# Patient Record
Sex: Male | Born: 1945 | Race: Black or African American | Hispanic: No | Marital: Single
Health system: Southern US, Community
[De-identification: ages and names within clinical notes are randomized; demographics above are authoritative.]

---

## 2017-06-27 ENCOUNTER — Emergency Department: Payer: Medicare HMO

## 2017-06-27 ENCOUNTER — Other Ambulatory Visit: Payer: Self-pay

## 2017-06-27 ENCOUNTER — Emergency Department
Admission: EM | Admit: 2017-06-27 | Discharge: 2017-06-27 | Disposition: A | Discharge status: Home / Self Care | Payer: Medicare HMO | Attending: Student in an Organized Health Care Education/Training Program | Admitting: Student in an Organized Health Care Education/Training Program

## 2017-06-27 DIAGNOSIS — R531 Weakness: Secondary | ICD-10-CM | POA: Diagnosis not present

## 2017-06-27 DIAGNOSIS — D649 Anemia, unspecified: Secondary | ICD-10-CM | POA: Insufficient documentation

## 2017-06-27 LAB — COMPREHENSIVE METABOLIC PANEL
ALT: 10 U/L — ABNORMAL LOW (ref 17–63)
ANION GAP: 13 (ref 5–15)
AST: 35 U/L (ref 15–41)
Albumin: 4.3 g/dL (ref 3.5–5.0)
Alkaline Phosphatase: 297 U/L — ABNORMAL HIGH (ref 38–126)
BILIRUBIN TOTAL: 1 mg/dL (ref 0.3–1.2)
BUN: 20 mg/dL (ref 6–20)
CO2: 20 mmol/L — ABNORMAL LOW (ref 22–32)
Calcium: 9.4 mg/dL (ref 8.9–10.3)
Chloride: 106 mmol/L (ref 101–111)
Creatinine, Ser: 1.81 mg/dL — ABNORMAL HIGH (ref 0.61–1.24)
GFR, EST AFRICAN AMERICAN: 41 mL/min — AB (ref >60)
GFR, EST NON AFRICAN AMERICAN: 35 mL/min — AB (ref >60)
Glucose, Bld: 206 mg/dL — ABNORMAL HIGH (ref 65–99)
POTASSIUM: 4.2 mmol/L (ref 3.5–5.1)
Sodium: 139 mmol/L (ref 135–145)
TOTAL PROTEIN: 7.7 g/dL (ref 6.5–8.1)

## 2017-06-27 LAB — CBC WITH DIFFERENTIAL/PLATELET
Basophils Absolute: 0 10*3/uL (ref 0–0.1)
Basophils Relative: 1 %
Eosinophils Absolute: 0.2 10*3/uL (ref 0–0.7)
Eosinophils Relative: 3 %
HEMATOCRIT: 29.5 % — AB (ref 40.0–52.0)
Hemoglobin: 9.3 g/dL — ABNORMAL LOW (ref 13.0–18.0)
Lymphocytes Relative: 25 %
Lymphs Abs: 1.3 10*3/uL (ref 1.0–3.6)
MCH: 21.6 pg — AB (ref 26.0–34.0)
MCHC: 31.4 g/dL — ABNORMAL LOW (ref 32.0–36.0)
MCV: 68.7 fL — AB (ref 80.0–100.0)
MONO ABS: 0.3 10*3/uL (ref 0.2–1.0)
MONOS PCT: 7 %
NEUTROS ABS: 3.5 10*3/uL (ref 1.4–6.5)
Neutrophils Relative %: 64 %
Platelets: 363 10*3/uL (ref 150–440)
RBC: 4.3 MIL/uL — ABNORMAL LOW (ref 4.40–5.90)
RDW: 20.8 % — AB (ref 11.5–14.5)
WBC: 5.4 10*3/uL (ref 3.8–10.6)

## 2017-06-27 LAB — URINALYSIS, COMPLETE (UACMP) WITH MICROSCOPIC
BACTERIA UA: NONE SEEN
Bilirubin Urine: NEGATIVE
GLUCOSE, UA: NEGATIVE mg/dL
Hgb urine dipstick: NEGATIVE
Ketones, ur: 5 mg/dL — AB
Leukocytes, UA: NEGATIVE
NITRITE: NEGATIVE
PROTEIN: NEGATIVE mg/dL
SPECIFIC GRAVITY, URINE: 1.011 (ref 1.005–1.030)
pH: 5 (ref 5.0–8.0)

## 2017-06-27 LAB — TROPONIN I

## 2017-06-27 LAB — CK: CK TOTAL: 110 U/L (ref 49–397)

## 2017-06-27 MED ORDER — SODIUM CHLORIDE 0.9 % IV BOLUS
1000.0000 mL | Freq: Once | INTRAVENOUS | Status: AC
  Administered 2017-06-27: 1000 mL via INTRAVENOUS

## 2017-06-27 MED ORDER — IRON 325 (65 FE) MG PO TABS: 1.0000 | ORAL_TABLET | ORAL | 0 refills | Status: AC

## 2017-06-27 NOTE — ED Provider Notes (Signed)
[facility] Emergency Department Provider Note    First MD Initiated Contact with Patient 06/27/17 1447     (approximate)  I have reviewed the triage vital signs and the nursing notes.   HISTORY  Chief Complaint No chief complaint on file.    HPI Vincent Garcia is a 72 y.o. male patient with history of diabetes presents for confusion after being found in his car.  Patient states he was feeling overheated and went to go lay down in his car.  Family members found him laying down in his car and was more confused than normal.  EMS called out to the scene and brought him to the ER for confusion was worried that he had had heat exposure though they did not document her test his temperature.  Patient moving all extremities.  Denies any numbness or tingling.  Is not had symptoms like this in the past.  Alert and oriented x3.  Denies any palpitations, chest pain, shortness of breath, headache, nausea or vomiting.  No past medical history on file. No family history on file.  There are no active problems to display for this patient.     Prior to Admission medications   Not on File    Allergies Patient has no allergy information on record.    Social History Social History   Tobacco Use  . Smoking status: Not on file  Substance Use Topics  . Alcohol use: Not on file  . Drug use: Not on file    Review of Systems Patient denies headaches, rhinorrhea, blurry vision, numbness, shortness of breath, chest pain, edema, cough, abdominal pain, nausea, vomiting, diarrhea, dysuria, fevers, rashes or hallucinations unless otherwise stated above in HPI. ____________________________________________   PHYSICAL EXAM:  VITAL SIGNS: There were no vitals filed for this visit.  Constitutional: Alert and oriented x 3.  in no acute distress. Eyes: Conjunctivae are normal.  Head: Atraumatic. Nose: No congestion/rhinnorhea. Mouth/Throat: Mucous membranes are moist.     Neck: No stridor. Painless ROM.  Cardiovascular: Normal rate, regular rhythm. Grossly normal heart sounds.  Good peripheral circulation. Respiratory: Normal respiratory effort.  No retractions. Lungs CTAB. Gastrointestinal: Soft and nontender. No distention. No abdominal bruits. No CVA tenderness. Genitourinary: deferred Musculoskeletal: No lower extremity tenderness nor edema.  No joint effusions. Neurologic:  CN- intact.  No facial droop, Normal FNF.  Normal heel to shin.  Sensation intact bilaterally. Normal speech and language. No gross focal neurologic deficits are appreciated. No gait instability. Skin:  Skin is warm, dry and intact. No rash noted. Psychiatric: Mood and affect are normal. Speech and behavior are normal.  ____________________________________________   LABS (all labs ordered are listed, but only abnormal results are displayed)  No results found for this or any previous visit (from the past 24 hour(s)). ____________________________________________  EKG My review and personal interpretation at Time: 14:45   Indication: heat exposure  Rate: 95  Rhythm: sinus Axis: normal Other: normal intervals, no stemi ____________________________________________  RADIOLOGY  I personally reviewed all radiographic images ordered to evaluate for the above acute complaints and reviewed radiology reports and findings.  These findings were personally discussed with the patient.  Please see medical record for radiology report.  ____________________________________________   PROCEDURES  Procedure(s) performed:  Procedures    Critical Care performed: no ____________________________________________   INITIAL IMPRESSION / ASSESSMENT AND PLAN / ED COURSE  Pertinent labs & imaging results that were available during my care of the patient were reviewed by me and  considered in my medical decision making (see chart for details).  DDX: Heat syncope, heat exhaustion, dehydration,  ACS, TIA, hyperglycemia  Vincent Garcia is a 72 y.o. who presents to the ED with symptoms as described above.  Patient well-appearing currently.  No respiratory distress.  Nonfocal neuro exam.  No evidence of infectious process.  Due to suspicion for dehydration therefore will give IV fluids and reassess.  She does not seem clinically consistent with stroke or TIA.  The patient will be placed on continuous pulse oximetry and telemetry for monitoring.  Laboratory evaluation will be sent to evaluate for the above complaints.     Clinical Course as of Jun 27 1945  Wed Jun 27, 2017  1624 Patient up ambulating with steady gait   [PR]  1726 Patient reassessed.  Denies any weakness.  Brother at bedside states he is done this before when he is worked out in the heat.  Patient requesting discharge home.  I did recommend the patient stay for additional observation repeat enzyme and monitoring.  He has no focal neuro deficit therefore I do not believe this is related to stroke.  Does appear to have probable component of iron deficiency anemia.  Adamantly denies that he is not having any melena or hematochezia.  This does not seem clinically consistent with bleed.  No hypoxia to suggest pneumonia or congestive heart failure.  As he is in no acute distress adequate mentation and demonstrates understanding of signs and symptoms for which she should return to the ER to believe this is consistent with dehydration probable heat illness.  Have discussed with the patient and available family all diagnostics and treatments performed thus far and all questions were answered to the best of my ability. The patient demonstrates understanding and agreement with plan.    [PR]    Clinical Course User Index [PR] Willy Eddyobinson, Keimya Briddell, MD     As part of my medical decision making, I reviewed the following data within the electronic MEDICAL RECORD NUMBER Nursing notes reviewed and incorporated, Labs reviewed, notes from prior ED visits  and [place] Controlled Substance Database   ____________________________________________   FINAL CLINICAL IMPRESSION(S) / ED DIAGNOSES  Final diagnoses:  Weakness  Anemia, unspecified type      NEW MEDICATIONS STARTED DURING THIS VISIT:  New Prescriptions   No medications on file     Note:  This document was prepared using Dragon voice recognition software and may include unintentional dictation errors.    Willy Eddyobinson, Yocheved Depner, MD 06/27/17 [phone]

## 2017-06-27 NOTE — ED Triage Notes (Signed)
Pt arrived via [facility] from car impound where he was trying to get car. EMS states that pt was with friend and found pt in car with three shirts and not responding appropriately.

## 2019-04-15 IMAGING — CR DG CHEST 2V
2 series · 2 of 2 positions shown · non-contrast
Comparison: None.

CLINICAL DATA: Acute mental status changes.

EXAM:
CHEST - 2 VIEW

[chest pa]
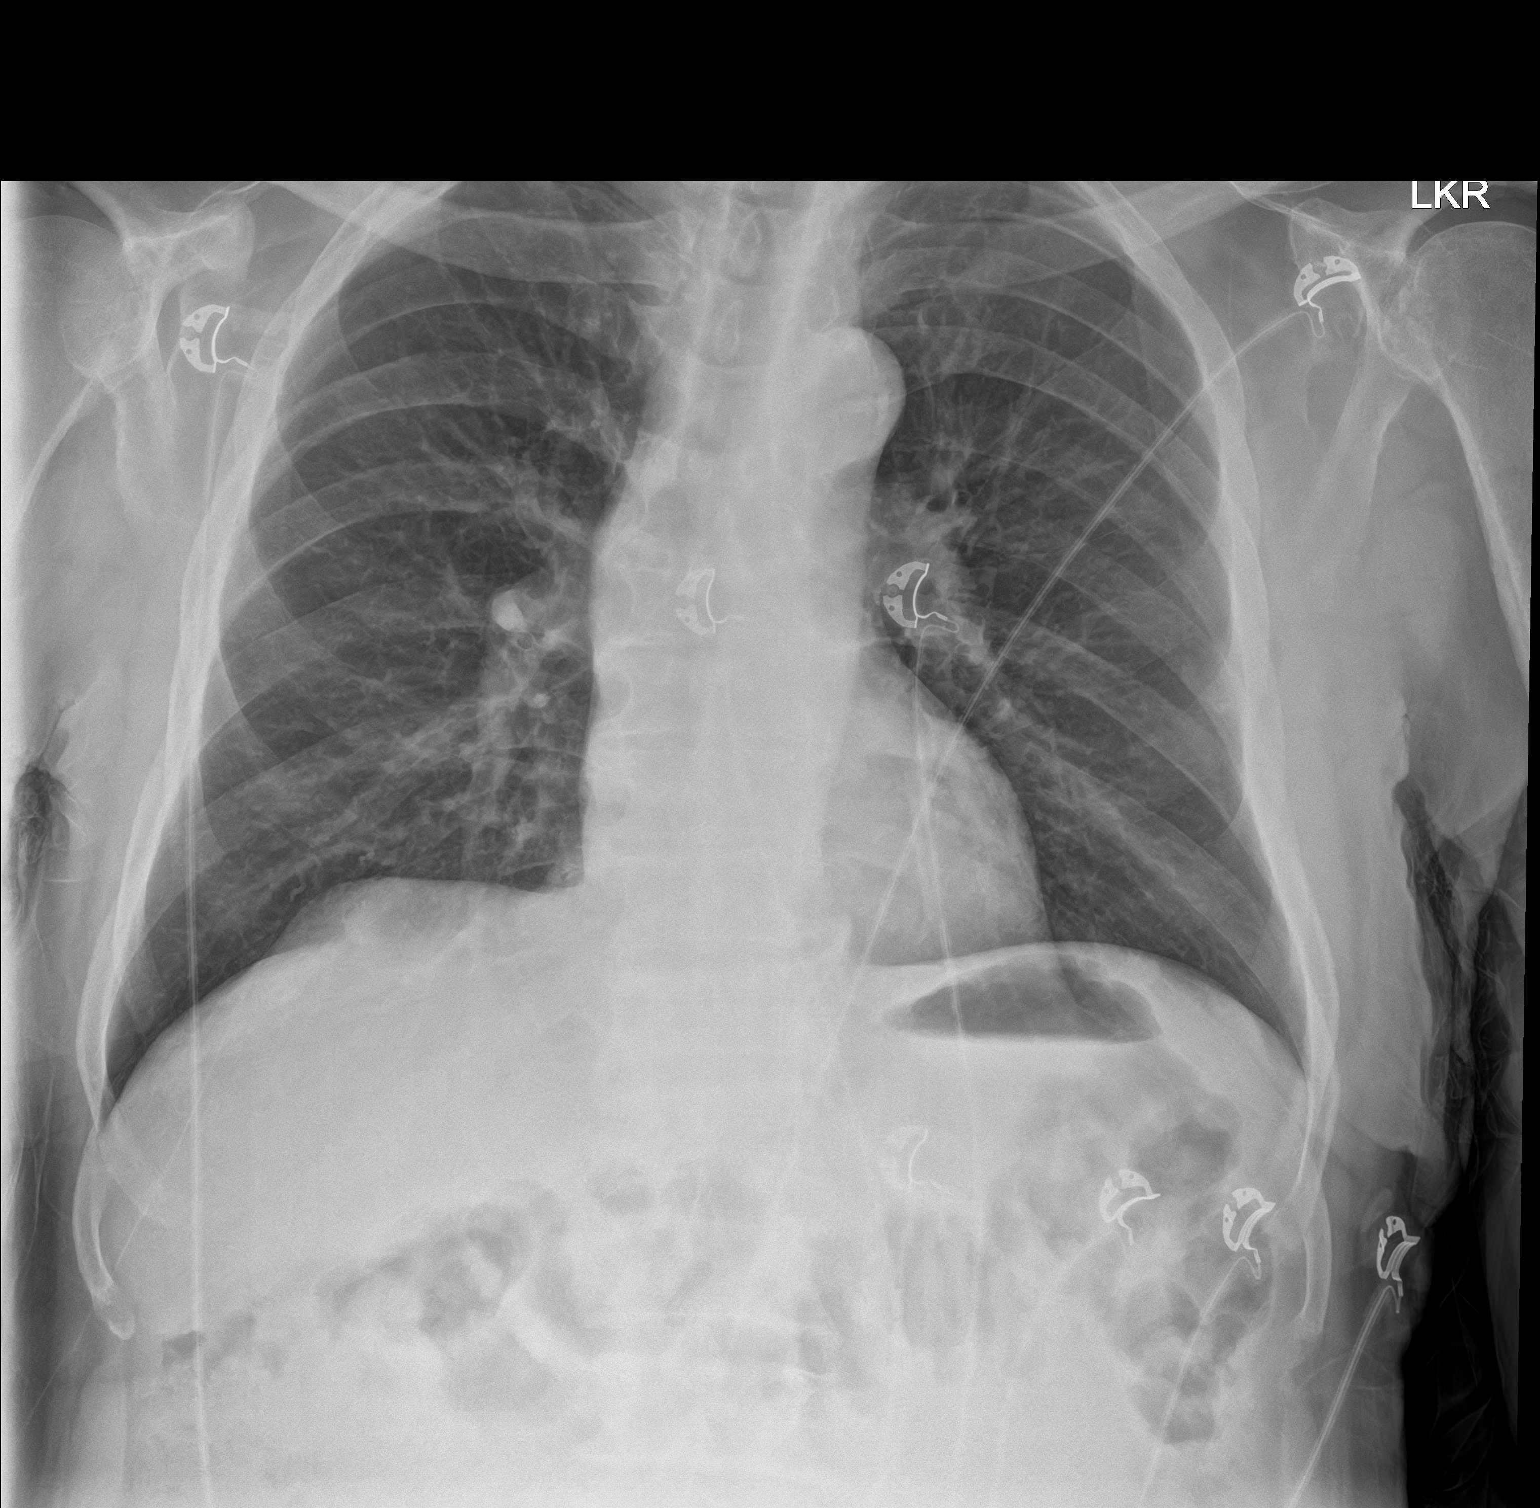

[chest lat]
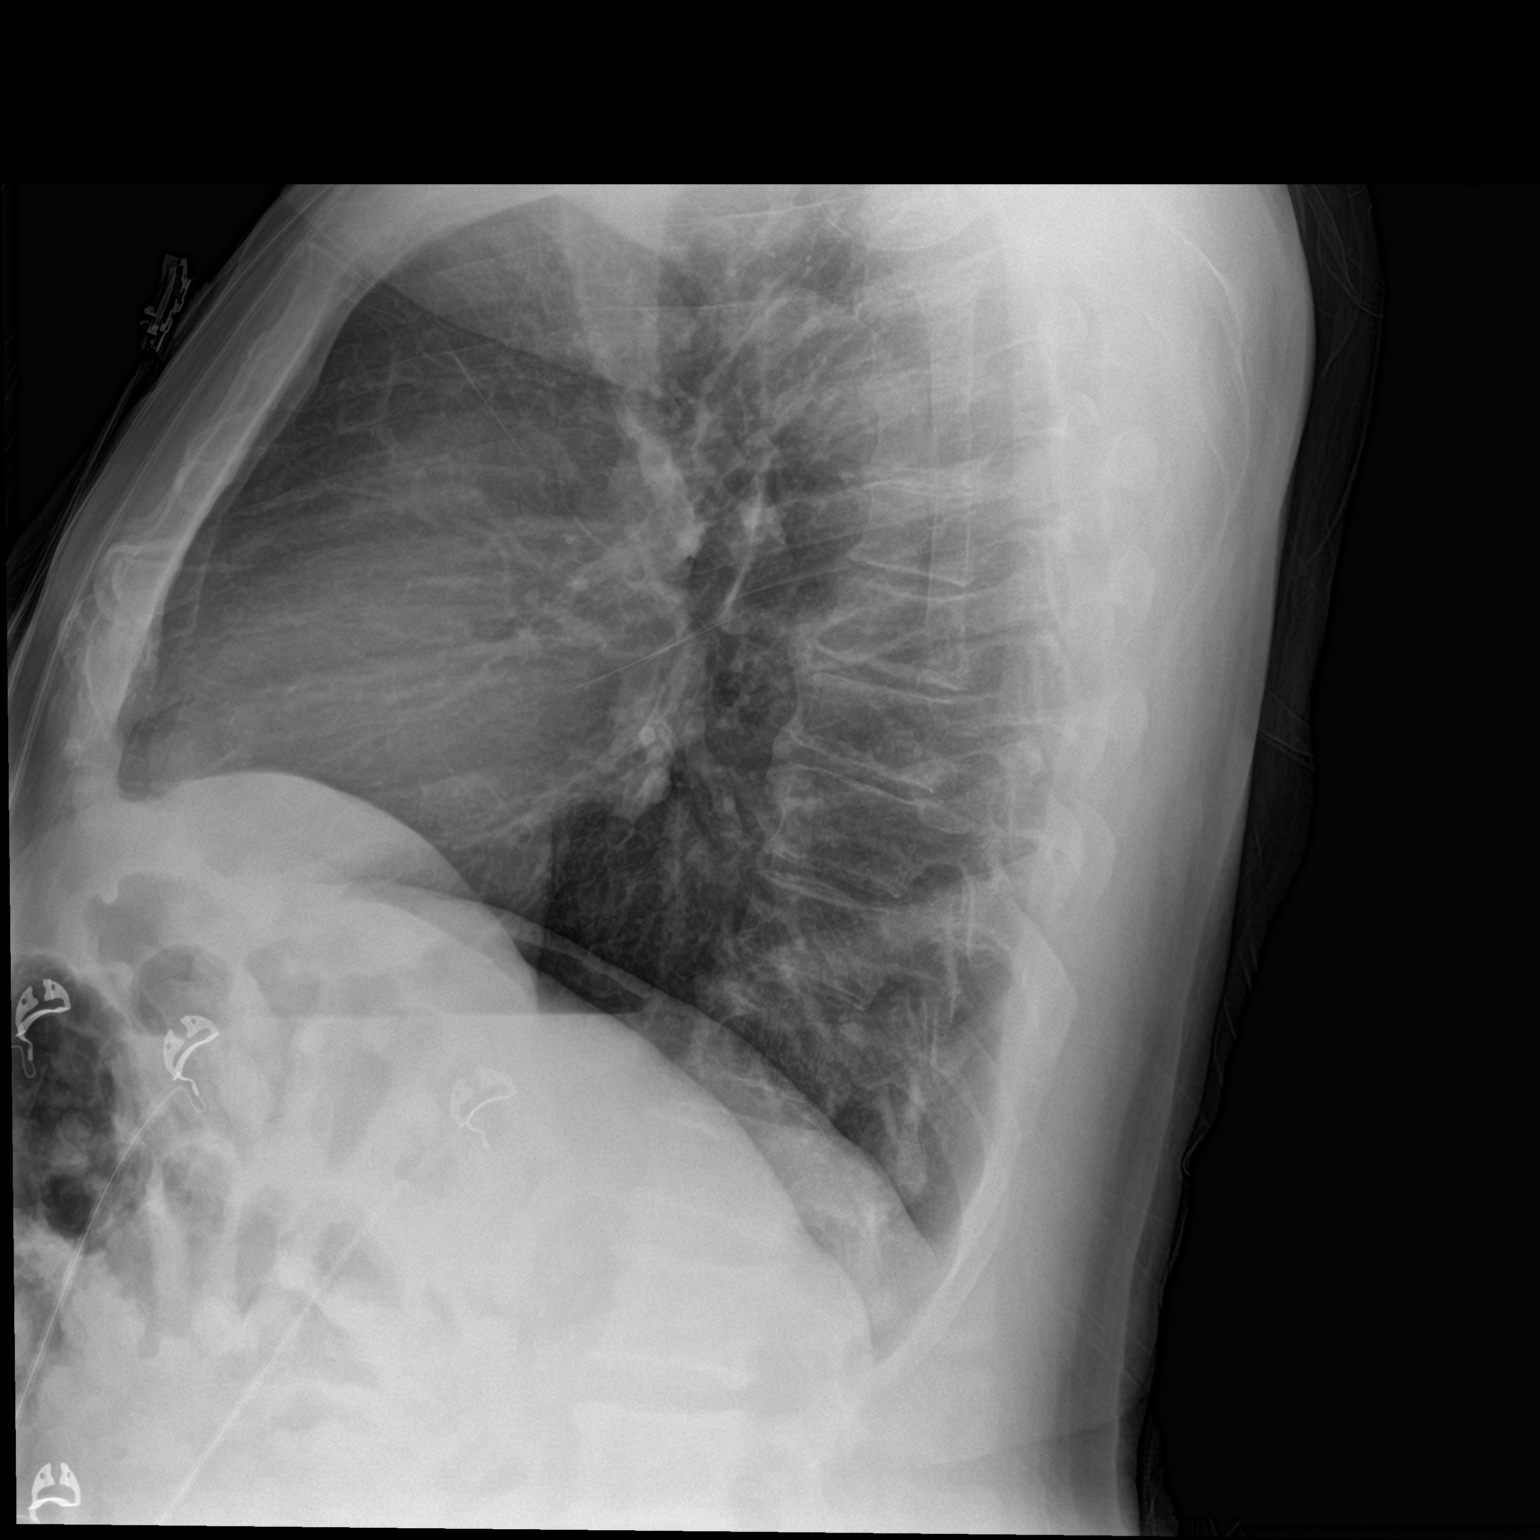

[2 of 2 positions shown; findings below may reference images not displayed]

FINDINGS: Heart size is normal. There is aortic atherosclerosis. The lungs are
clear. The vascularity is normal. No effusions. Ordinary
degenerative changes affect the spine.
IMPRESSION: No active disease.  Aortic atherosclerosis.

## 2019-06-08 ENCOUNTER — Ambulatory Visit: Payer: Medicare HMO | Attending: Internal Medicine

## 2019-06-08 DIAGNOSIS — Z23 Encounter for immunization: Secondary | ICD-10-CM

## 2019-06-08 NOTE — Progress Notes (Signed)
   Covid-19 Vaccination Clinic  Name:  Vincent Garcia    MRN: [id number] DOB: [birth year]  06/08/2019  Vincent Garcia was observed post Covid-19 immunization for 15 minutes without incident. He was provided with Vaccine Information Sheet and instruction to access the V-Safe system.   Vincent Garcia was instructed to call 911 with any severe reactions post vaccine: Marland Kitchen Difficulty breathing  . Swelling of face and throat  . A fast heartbeat  . A bad rash all over body  . Dizziness and weakness   Immunizations Administered    Name Date Dose VIS Date Route   Pfizer COVID-19 Vaccine 06/08/2019  2:38 PM 0.3 mL 02/21/2019 Intramuscular   Manufacturer: [employer], [employer]   Lot: [id number]   NDC: [address]-6

## 2019-07-01 ENCOUNTER — Ambulatory Visit: Payer: Medicare HMO | Attending: Internal Medicine

## 2019-07-01 DIAGNOSIS — Z23 Encounter for immunization: Secondary | ICD-10-CM

## 2019-07-01 NOTE — Progress Notes (Signed)
   Covid-19 Vaccination Clinic  Name:  Bertha Earwood    MRN: [id number] DOB: [birth year]  07/01/2019  Mr. Sorlie was observed post Covid-19 immunization for 15 minutes without incident. He was provided with Vaccine Information Sheet and instruction to access the V-Safe system.   Mr. Falotico was instructed to call 911 with any severe reactions post vaccine: Marland Kitchen Difficulty breathing  . Swelling of face and throat  . A fast heartbeat  . A bad rash all over body  . Dizziness and weakness   Immunizations Administered    Name Date Dose VIS Date Route   Pfizer COVID-19 Vaccine 07/01/2019  2:17 PM 0.3 mL 05/07/2018 Intramuscular   Manufacturer: [employer], [employer]   Lot: [id number]   NDC: [address]-4

## 2019-10-29 ENCOUNTER — Other Ambulatory Visit: Payer: Self-pay | Admitting: Orthopedic Surgery

## 2019-10-29 DIAGNOSIS — R948 Abnormal results of function studies of other organs and systems: Secondary | ICD-10-CM

## 2019-10-29 DIAGNOSIS — M899 Disorder of bone, unspecified: Secondary | ICD-10-CM

## 2019-11-18 ENCOUNTER — Ambulatory Visit
Admission: RE | Admit: 2019-11-18 | Discharge: 2019-11-18 | Disposition: A | Discharge status: Home / Self Care | Payer: Medicare HMO | Source: Ambulatory Visit | Attending: Orthopedic Surgery | Admitting: Orthopedic Surgery

## 2019-11-18 ENCOUNTER — Other Ambulatory Visit: Payer: Self-pay

## 2019-11-18 DIAGNOSIS — R948 Abnormal results of function studies of other organs and systems: Secondary | ICD-10-CM | POA: Insufficient documentation

## 2019-11-18 DIAGNOSIS — M899 Disorder of bone, unspecified: Secondary | ICD-10-CM | POA: Insufficient documentation

## 2019-11-18 MED ORDER — GADOBUTROL 1 MMOL/ML IV SOLN
10.0000 mL | Freq: Once | INTRAVENOUS | Status: AC | PRN
  Administered 2019-11-18: 10 mL via INTRAVENOUS

## 2020-12-25 ENCOUNTER — Other Ambulatory Visit: Payer: Self-pay

## 2020-12-25 ENCOUNTER — Emergency Department
Admission: EM | Admit: 2020-12-25 | Discharge: 2020-12-25 | Disposition: A | Discharge status: Home / Self Care | Payer: Medicare HMO | Attending: Emergency Medicine | Admitting: Emergency Medicine

## 2020-12-25 ENCOUNTER — Emergency Department: Payer: Medicare HMO

## 2020-12-25 DIAGNOSIS — X58XXXA Exposure to other specified factors, initial encounter: Secondary | ICD-10-CM | POA: Insufficient documentation

## 2020-12-25 DIAGNOSIS — M79604 Pain in right leg: Secondary | ICD-10-CM | POA: Diagnosis not present

## 2020-12-25 DIAGNOSIS — R1031 Right lower quadrant pain: Secondary | ICD-10-CM

## 2020-12-25 DIAGNOSIS — Z7984 Long term (current) use of oral hypoglycemic drugs: Secondary | ICD-10-CM | POA: Diagnosis not present

## 2020-12-25 DIAGNOSIS — S76211A Strain of adductor muscle, fascia and tendon of right thigh, initial encounter: Secondary | ICD-10-CM | POA: Insufficient documentation

## 2020-12-25 DIAGNOSIS — S79921A Unspecified injury of right thigh, initial encounter: Secondary | ICD-10-CM | POA: Diagnosis present

## 2020-12-25 MED ORDER — OXYCODONE-ACETAMINOPHEN 5-325 MG PO TABS
1.0000 | ORAL_TABLET | Freq: Once | ORAL | Status: AC
  Administered 2020-12-25: 1 via ORAL
  Filled 2020-12-25: qty 1

## 2020-12-25 MED ORDER — LIDOCAINE 5 % EX PTCH
1.0000 | MEDICATED_PATCH | CUTANEOUS | Status: DC
  Administered 2020-12-25: 1 via TRANSDERMAL
  Filled 2020-12-25: qty 1

## 2020-12-25 MED ORDER — OXYCODONE-ACETAMINOPHEN 5-325 MG PO TABS: 1.0000 | ORAL_TABLET | ORAL | 0 refills | Status: AC | PRN

## 2020-12-25 NOTE — Discharge Instructions (Addendum)
No acute findings on ultrasound of the right inguinal area.  Read and follow discharge care instruction.  Take medication as directed.

## 2020-12-25 NOTE — ED Notes (Signed)
Pt also states that he has some "pus" in his left thumbnail that he has a band aid on at this time.

## 2020-12-25 NOTE — ED Notes (Signed)
US at bedside

## 2020-12-25 NOTE — ED Provider Notes (Signed)
[facility] Emergency Department Provider Note   ____________________________________________   Event Date/Time   First MD Initiated Contact with Patient 12/25/20 [phone]     (approximate)  I have reviewed the triage vital signs and the nursing notes.   HISTORY  Chief Complaint Groin Pain    HPI Vincent Garcia is a 75 y.o. male patient complain of right inguinal pain radiating to the scrotum.  Onset of complaint was 3 to 4 days ago.  Patient denies dysuria, urinary frequency, or urgency.  No provocative incident for complaint.  Rates his pain as 8/10.  Describes pain as "sharp/achy".  No palliative measure for complaint.         History reviewed. No pertinent past medical history.  There are no problems to display for this patient.   History reviewed. No pertinent surgical history.  Prior to Admission medications   Medication Sig Start Date End Date Taking? Authorizing Provider  oxyCODONE-acetaminophen (PERCOCET) 5-325 MG tablet Take 1 tablet by mouth every 4 (four) hours as needed for severe pain. 12/25/20 12/25/21 Yes Joni Reining, PA-C  enalapril (VASOTEC) 20 MG tablet Take 1 tablet by mouth 2 (two) times daily. 04/19/17   [provider]  Ferrous Sulfate (IRON) 325 (65 Fe) MG TABS Take 1 tablet (325 mg total) by mouth every other day. 06/27/17   Willy Eddy, MD  glipiZIDE (GLUCOTROL XL) 10 MG 24 hr tablet Take 1 tablet by mouth daily. 04/19/17   [provider]  hydrochlorothiazide (HYDRODIURIL) 25 MG tablet Take 1 tablet by mouth daily. 04/19/17   [provider]  JANUMET XR 50-1000 MG TB24 Take 1 tablet by mouth 2 (two) times daily. 04/28/17   [provider]  pravastatin (PRAVACHOL) 20 MG tablet Take 1 tablet by mouth daily. 04/19/17   [provider]    Allergies Patient has no known allergies.  History reviewed. No pertinent family history.  Social History    Review of  Systems Constitutional: No fever/chills Eyes: No visual changes. ENT: No sore throat. Cardiovascular: Denies chest pain. Respiratory: Denies shortness of breath. Gastrointestinal: No abdominal pain.  No nausea, no vomiting.  No diarrhea.  No constipation. Genitourinary: Negative for dysuria.  Right inguinal pain. Musculoskeletal: Negative for back pain. Skin: Negative for rash. Neurological: Negative for headaches, focal weakness or numbness. Endocrine: Diabetes, hyperlipidemia, and hypertension.  ____________________________________________   PHYSICAL EXAM:  VITAL SIGNS: ED Triage Vitals  Enc Vitals Group     BP 12/25/20 0906 127/75     Pulse Rate 12/25/20 0906 (!) 112     Resp 12/25/20 0904 18     Temp 12/25/20 0904 98.3 F (36.8 C)     Temp Source 12/25/20 0904 Oral     SpO2 12/25/20 0904 98 %     Weight 12/25/20 0905 180 lb (81.6 kg)     Height 12/25/20 0905 5\' 9"  (1.753 m)     Head Circumference --      Peak Flow --      Pain Score 12/25/20 0904 8     Pain Loc --      Pain Edu? --      Excl. in GC? --     Constitutional: Alert and oriented.  Moderate distress. Cardiovascular: Normal rate, regular rhythm. Grossly normal heart sounds.  Good peripheral circulation. Respiratory: Normal respiratory effort.  No retractions. Lungs CTAB. Gastrointestinal: Abdominal distention secondary to body habitus.  Moderate guarding palpation right inguinal area.  Normoactive bowel sounds.  Genitourinary:  Deferred Musculoskeletal: Right lower extremity tenderness without edema.  Effusions. Neurologic:  Normal speech and language. No gross focal neurologic deficits are appreciated. No gait instability. Skin:  Skin is warm, dry and intact. No rash noted. Psychiatric: Mood and affect are normal. Speech and behavior are normal.  ____________________________________________   LABS (all labs ordered are listed, but only abnormal results are displayed)  Labs Reviewed - No data to  display ____________________________________________  EKG   ____________________________________________  RADIOLOGY I, Joni Reining, personally viewed and evaluated these images (plain radiographs) as part of my medical decision making, as well as reviewing the written report by the radiologist.  ED MD interpretation:    Official radiology report(s): [place] RT LOWER EXTREM LTD SOFT TISSUE NON VASCULAR  Result Date: 12/25/2020 CLINICAL DATA:  RIGHT inguinal pain. EXAM: ULTRASOUND RIGHT LOWER EXTREMITY LIMITED TECHNIQUE: Ultrasound examination of the lower extremity soft tissues was performed in the area of clinical concern. COMPARISON:  None. FINDINGS: Ultrasound of the RIGHT groin/inguinal region was performed. No soft tissue mass or fluid collection. No enlarged lymph nodes are identified. IMPRESSION: Normal ultrasound of the RIGHT groin/inguinal region. Electronically Signed   By: Bary Richard M.D.   On: 12/25/2020 10:17    ____________________________________________   PROCEDURES  Procedure(s) performed (including Critical Care):  Procedures   ____________________________________________   INITIAL IMPRESSION / ASSESSMENT AND PLAN / ED COURSE  As part of my medical decision making, I reviewed the following data within the electronic MEDICAL RECORD NUMBER         Patient presents for right inguinal pain radiating to the scrotum.  Patient denies any urinary complaint.  Discussed no acute findings on ultrasound of the right inguinal/pelvic area.  Patient has moderate relief with Percocet.  Patient given discharge care instructions and advised follow-up PCP for continued care.      ____________________________________________   FINAL CLINICAL IMPRESSION(S) / ED DIAGNOSES  Final diagnoses:  Inguinal strain, right, initial encounter     ED Discharge Orders          Ordered    oxyCODONE-acetaminophen (PERCOCET) 5-325 MG tablet  Every 4 hours PRN        12/25/20 1038              Note:  This document was prepared using Dragon voice recognition software and may include unintentional dictation errors.    Joni Reining, PA-C 12/25/20 1041    Sharyn Creamer, MD 12/25/20 [phone]

## 2020-12-25 NOTE — ED Triage Notes (Signed)
Pt reports 3 days ago started with pain in his right groin that radiates into his right thing and stops at his knee. Pt denies injuries. Pt reports was sitting when the pain started.

## 2021-03-13 DEATH — deceased

## 2021-09-05 IMAGING — MR MR PELVIS WO/W CM
9 of 12 series · 34 of 48 positions shown · IV contrast (gadavist)
Comparison: None.

CLINICAL DATA: Recent outside bone scan concerning for bony
metastases in the pelvis. The patient has no complaints and denies
any history of cancer.

EXAM:
MRI PELVIS WITHOUT AND WITH CONTRAST
TECHNIQUE: Multiplanar multisequence MR imaging of the pelvis was performed
both before and after administration of intravenous contrast.
CONTRAST:  10mL GADAVIST GADOBUTROL 1 MMOL/ML IV SOLN

[Series 2: T2 · coronal · 5.0mm · 1.41mm/px · 4 of 35 slices shown (1 of 2)]
[im 1/35]
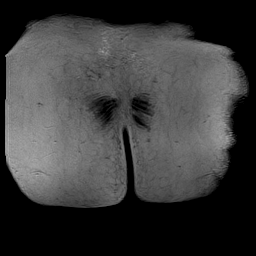
[im 12/35]
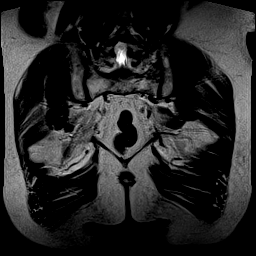
[im 23/35]
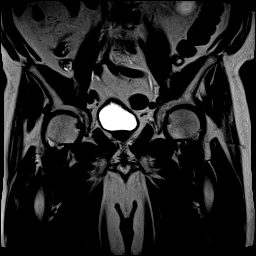
[im 35/35]
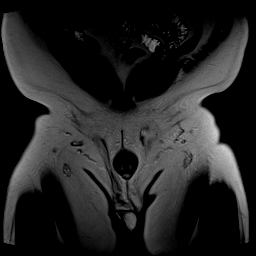

[Series 3: T2 · axial · 5.0mm · 1.02mm/px · z∈[-84,+132]mm · 3 of 37 slices shown (2 of 2)]
[im 1/37]
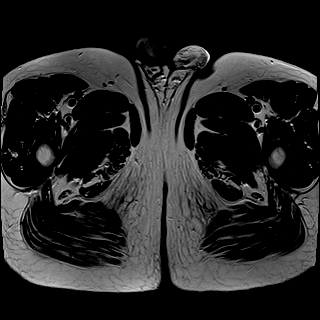
[im 19/37]
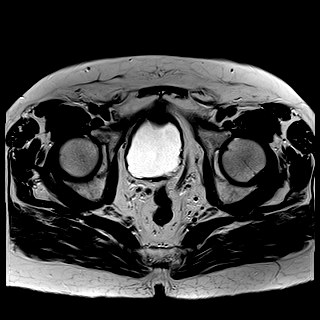
[im 37/37]
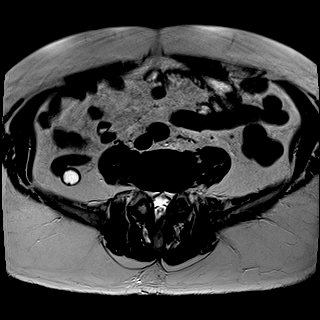

[Series 4: T2 fat-sat · axial · 5.0mm · 1.13mm/px · z∈[-84,+132]mm · 3 of 37 slices shown (1 of 3)]
[im 1/37]
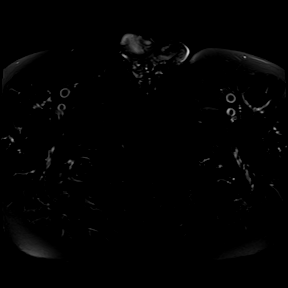
[im 19/37]
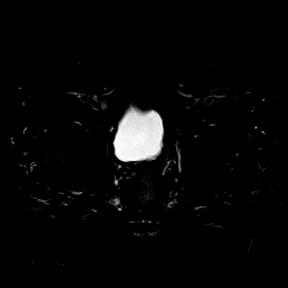
[im 37/37]
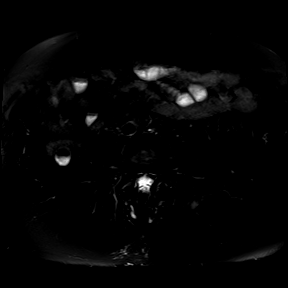

[Series 5: T2 fat-sat · axial · 4.0mm · 0.74mm/px · z∈[-108,+147]mm · 5 of 52 slices shown (2 of 3)]
[im 1/52]
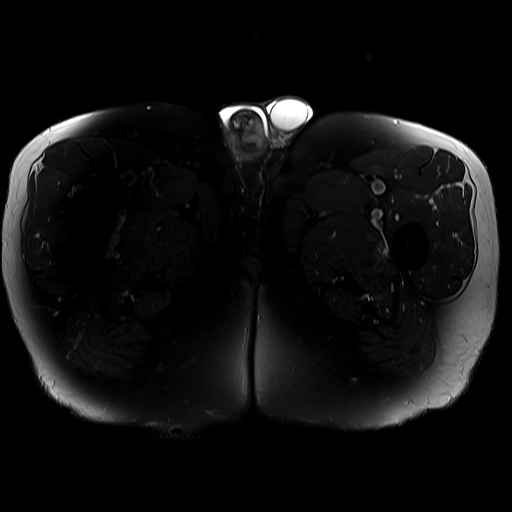
[im 13/52]
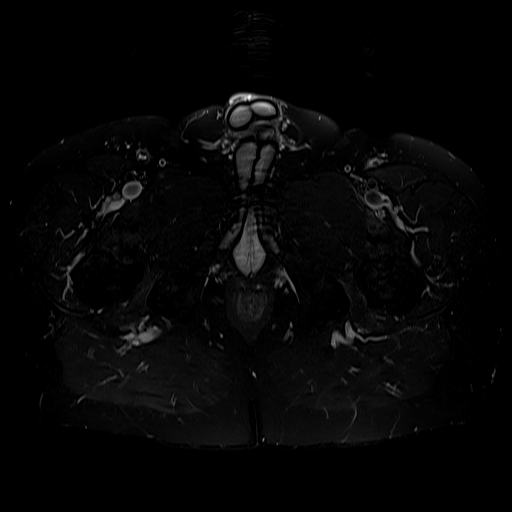
[im 26/52]
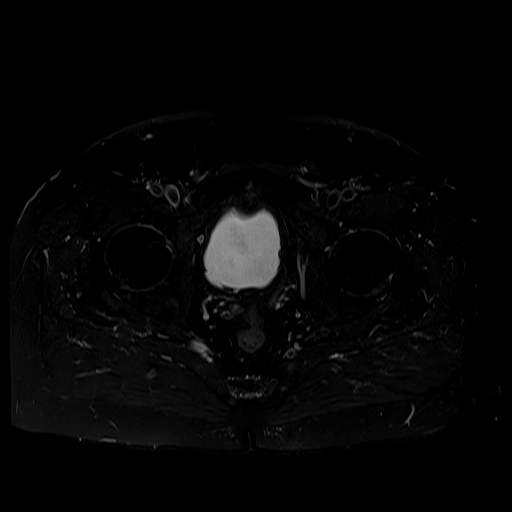
[im 39/52]
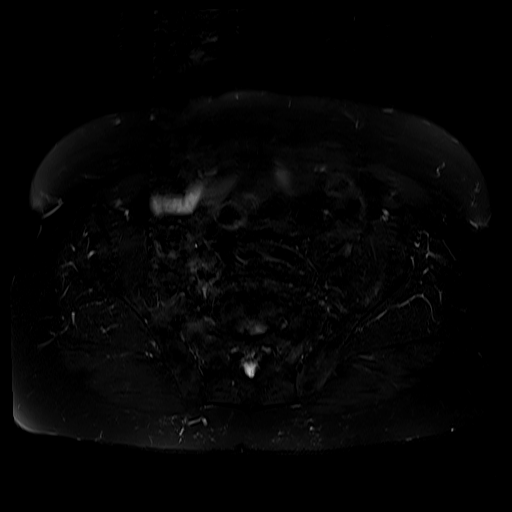
[im 52/52]
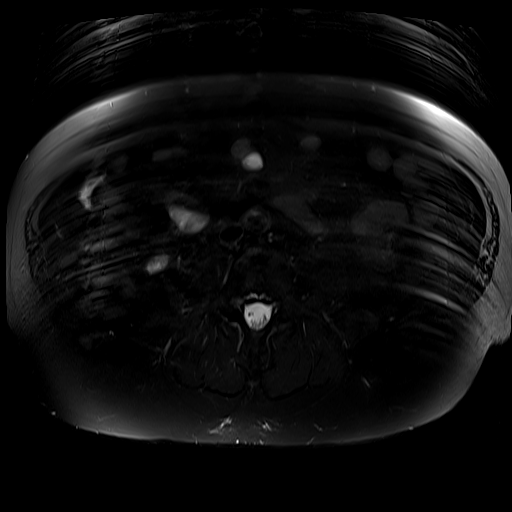

[Series 6: T1 · axial · 4.0mm · 0.74mm/px · z∈[-96,+134]mm · 4 of 47 slices shown (1 of 2)]
[im 1/47]
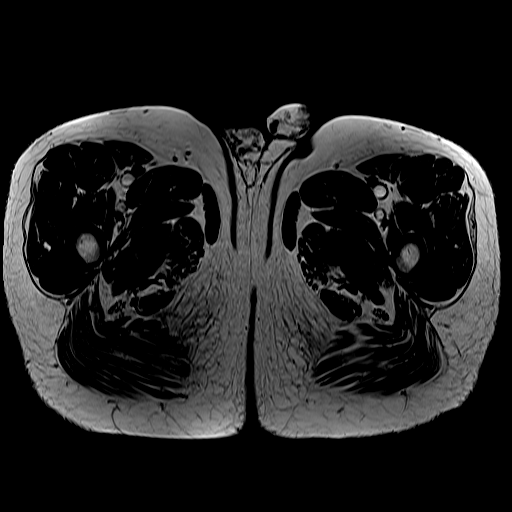
[im 16/47]
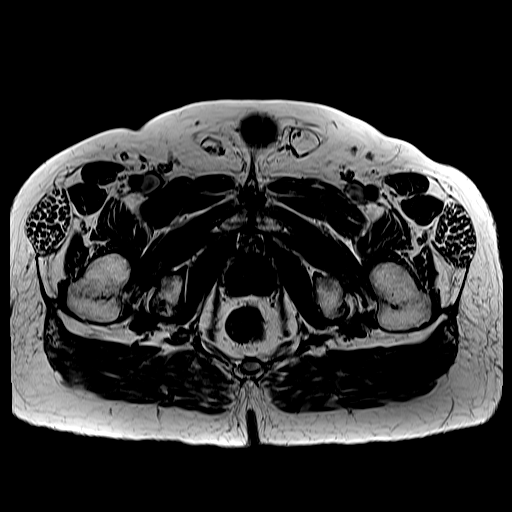
[im 31/47]
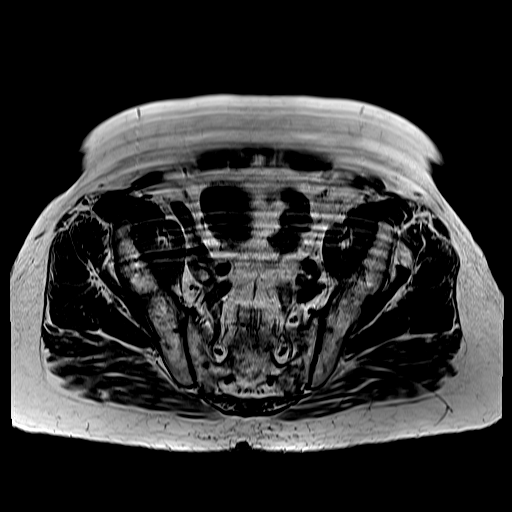
[im 47/47]
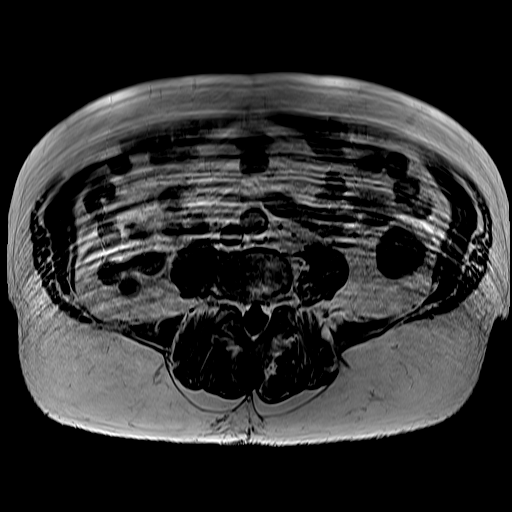

[Series 7: sag tse · sagittal · 6.5mm · 0.75mm/px · 3 of 39 slices shown]
[im 1/39]
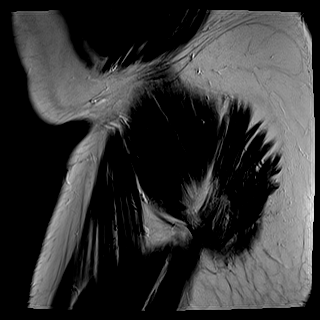
[im 20/39]
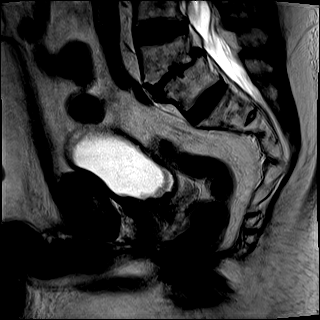
[im 39/39]
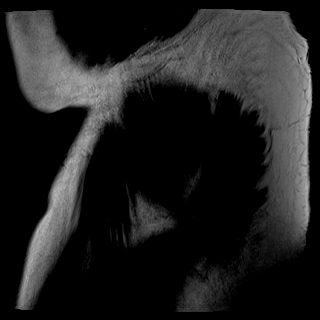

[Series 8: STIR · coronal · 4.0mm · 1.19mm/px · 4 of 41 slices shown]
[im 1/41]
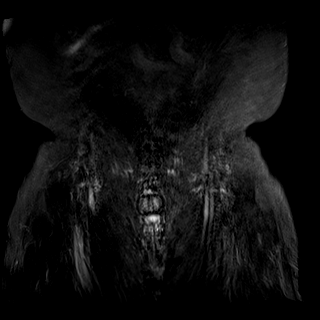
[im 14/41]
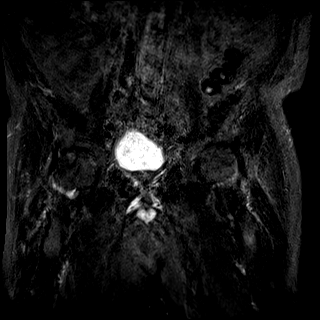
[im 27/41]
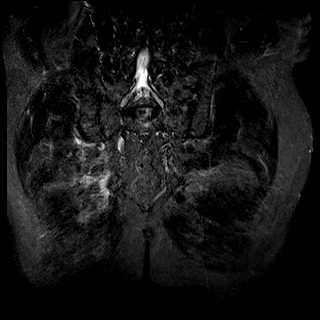
[im 41/41]
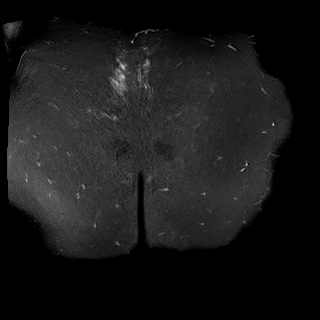

[Series 9: T1 · coronal · 4.0mm · 1.25mm/px · 4 of 41 slices shown (2 of 2)]
[im 1/41]
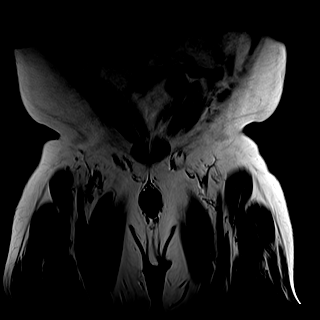
[im 14/41]
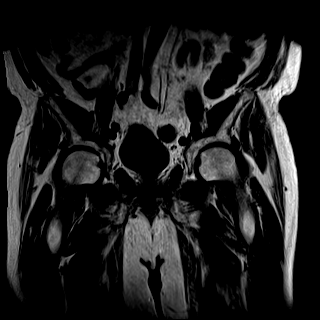
[im 27/41]
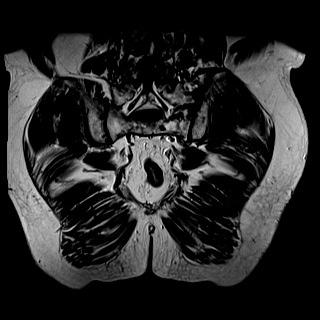
[im 41/41]
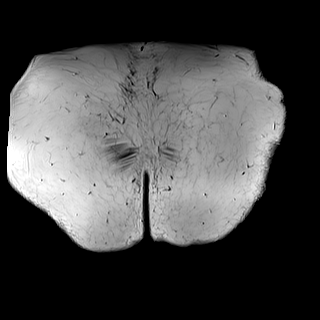

[Series 11: T2 fat-sat · sagittal · 4.0mm · 0.94mm/px · 4 of 72 slices shown (3 of 3)]
[im 1/72]
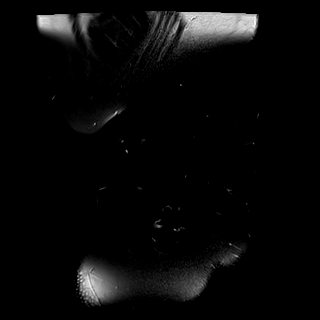
[im 15/72]
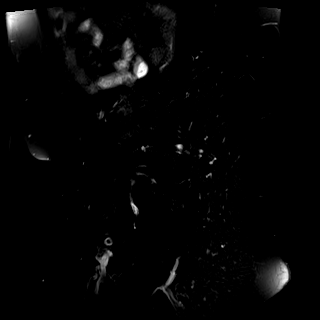
[im 29/72]
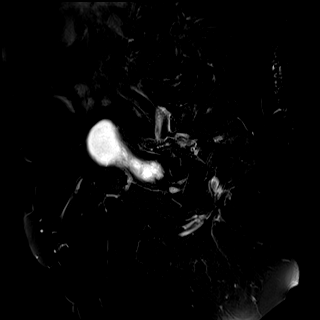
[im 43/72]
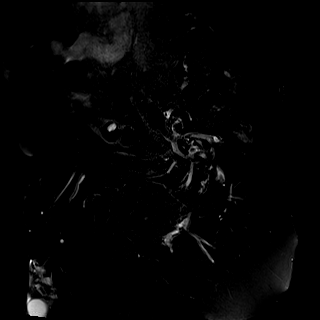

[34 of 48 positions shown; findings below may reference images not displayed]

FINDINGS: Musculoskeletal: Patchy areas of ill-defined marrow heterogeneity
and sclerosis involving the left greater than right sacrum, both
iliac bones, and both ischial tuberosities. There is some faint
increased T2 signal and associated enhancement in some of these
areas, but no discrete bone lesion is identified. No obvious
cortical or trabecular thickening.

No acute fracture or dislocation. No significant hip joint effusion.
The visualized gluteal, iliopsoas, and hamstring tendons are
unremarkable. No muscle edema or atrophy.

Urinary Tract: Partially visualized left renal cysts. Unremarkable
bladder.

Bowel:  Unremarkable visualized pelvic bowel loops.

Vascular/Lymphatic: No pathologically enlarged lymph nodes. No
significant vascular abnormality seen.

Reproductive:  No mass or other significant abnormality.

Other:  Small fat containing left inguinal hernia.
IMPRESSION: 1. Patchy areas of ill-defined marrow heterogeneity and sclerosis
involving the left greater than right sacrum, both iliac bones, and
both ischial tuberosities. There is some faint increased T2 signal
and associated enhancement in some of these areas, but no discrete
bone lesion is identified. Differential considerations include
metastatic disease, multiple myeloma, and Paget disease. Appropriate
oncologic workup is recommended. Consider further evaluation with
PET-CT.

## 2022-10-13 IMAGING — US US EXTREM LOW*R* LIMITED
1 series · 14 of 14 positions shown · non-contrast
Comparison: None.

CLINICAL DATA: RIGHT inguinal pain.

EXAM:
ULTRASOUND RIGHT LOWER EXTREMITY LIMITED
TECHNIQUE: Ultrasound examination of the lower extremity soft tissues was
performed in the area of clinical concern.

[Series 1: us soft tissue lower extremity limited right (non- · 14 acquisitions, 14 frames shown]
[im 1/14]
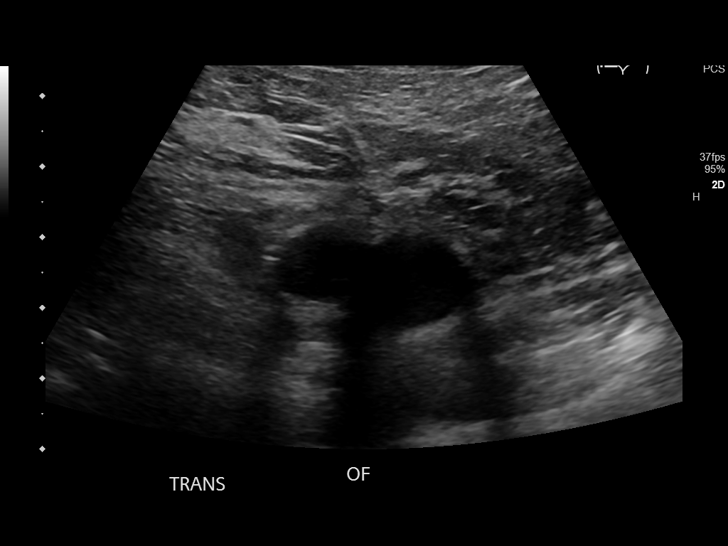
[im 2/14]
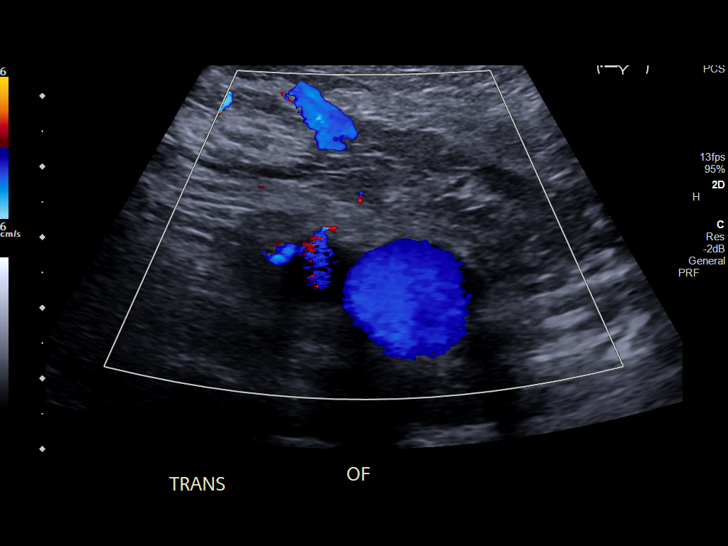
[im 3/14]
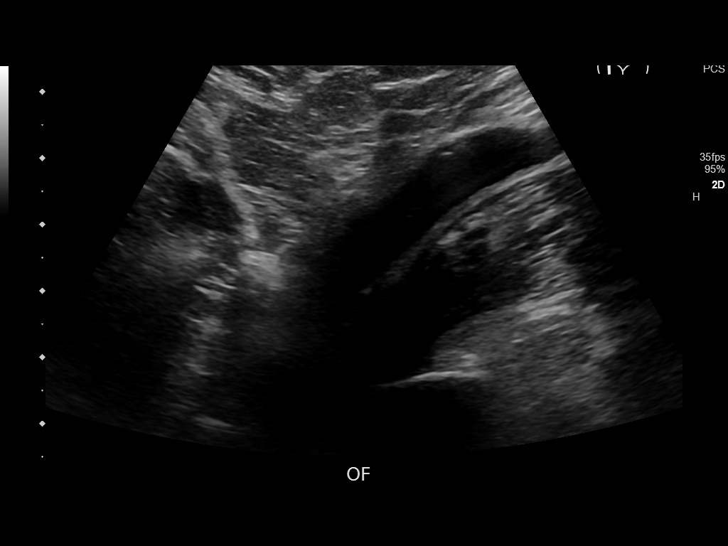
[im 4/14]
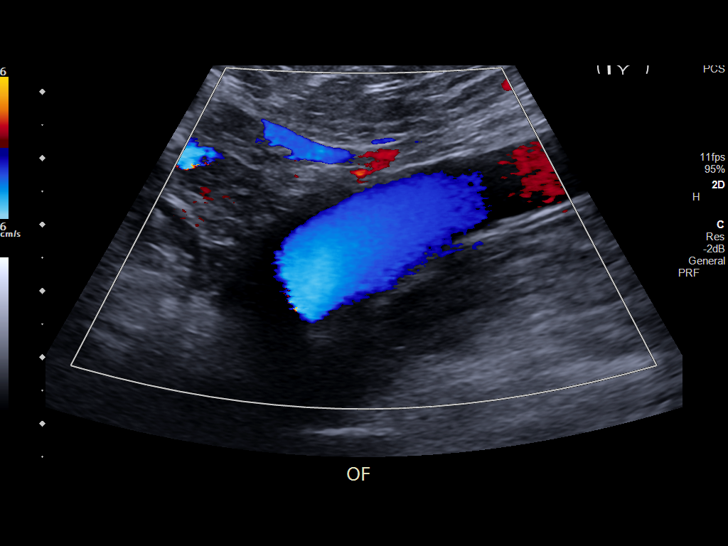
[im 5/14]
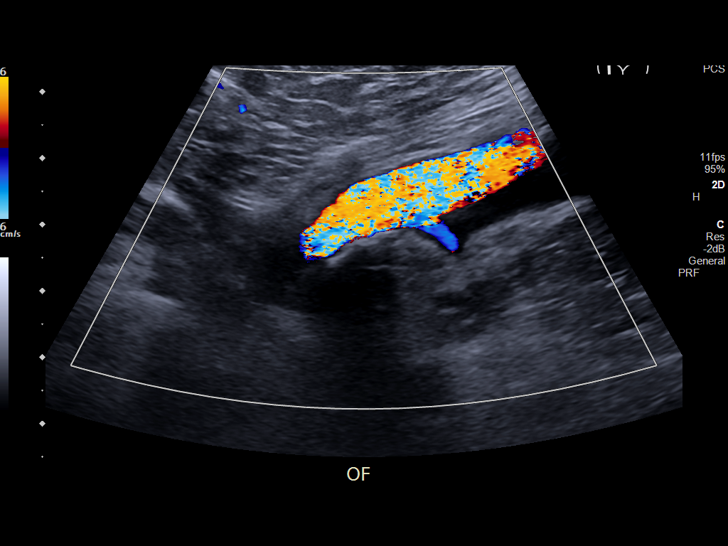
[im 6/14]
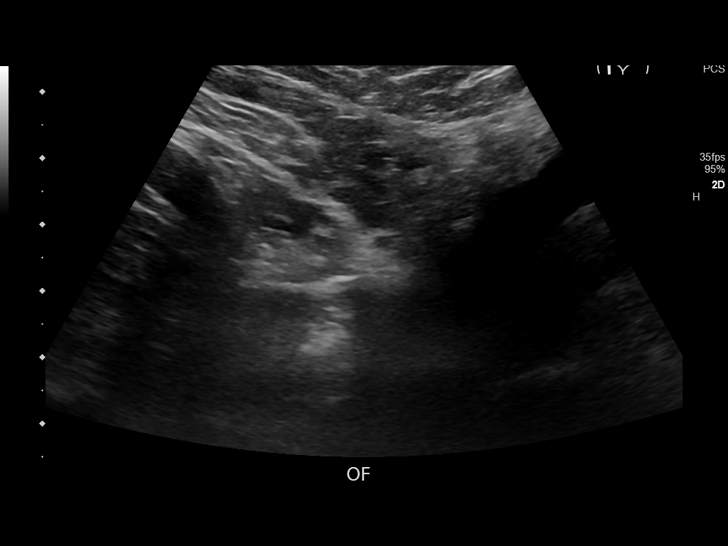
[im 7/14]
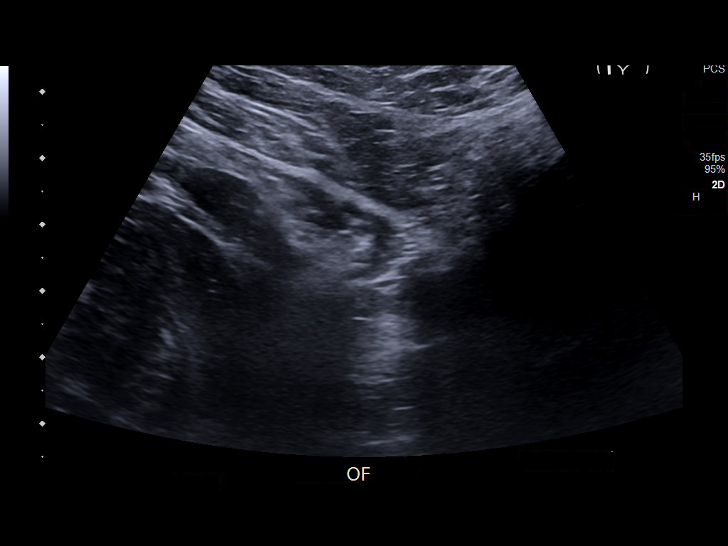
[im 8/14]
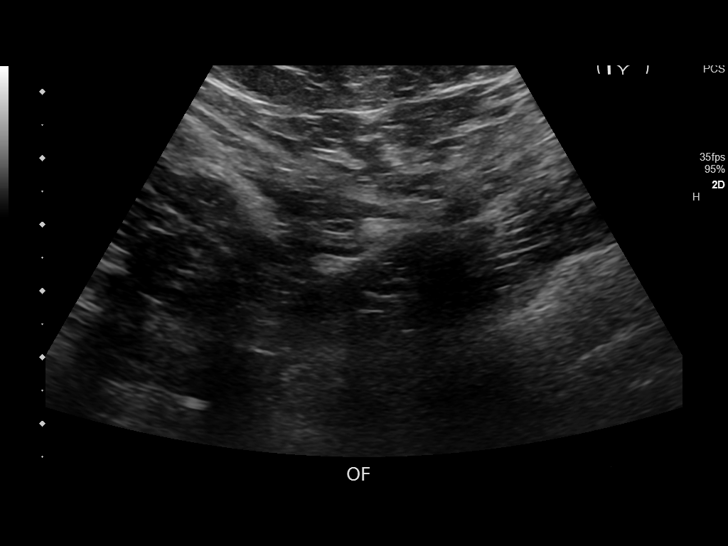
[im 9/14]
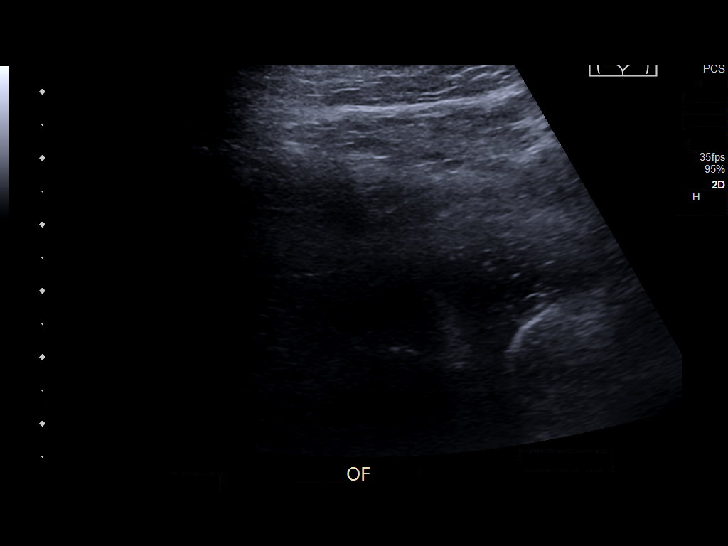
[im 10/14]
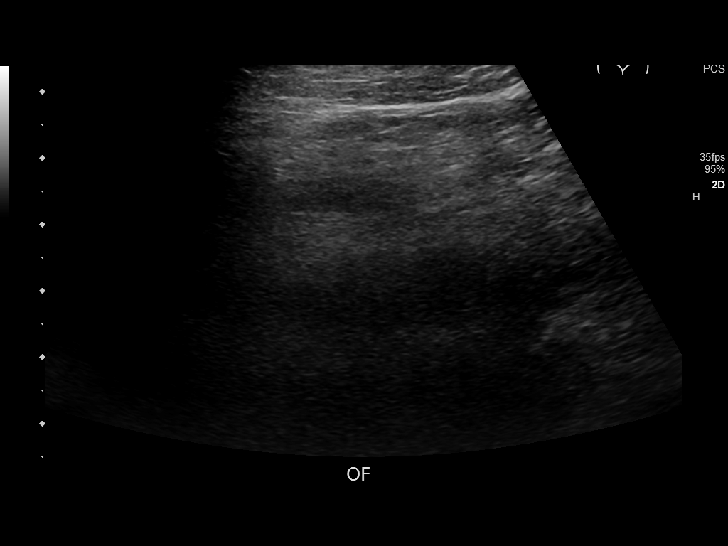
[im 11/14]
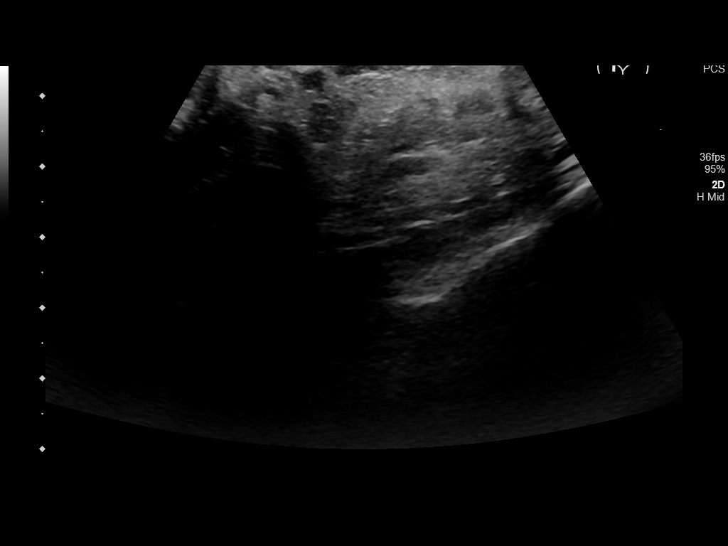
[im 12/14]
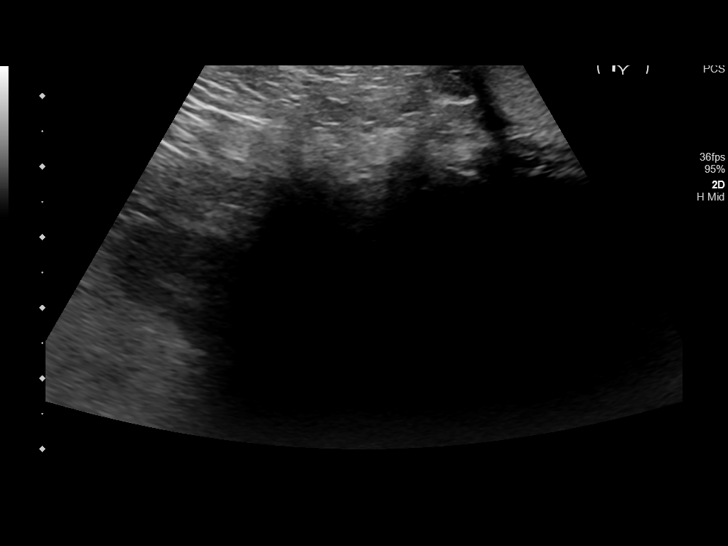
[im 13/14]
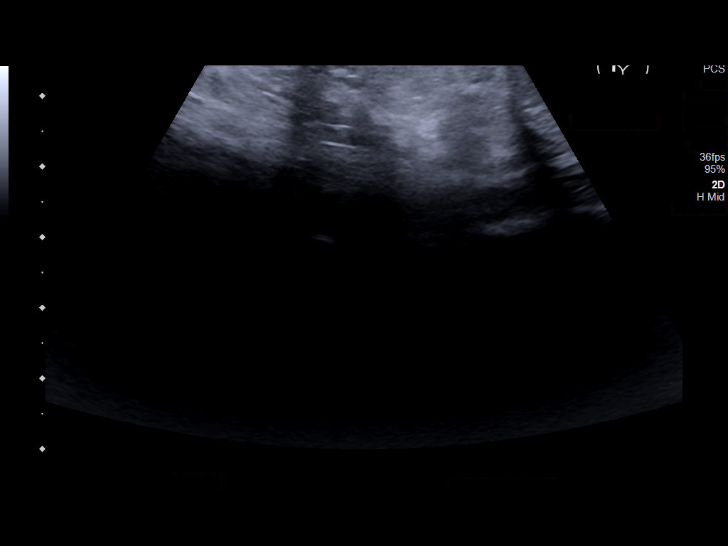
[im 14/14]
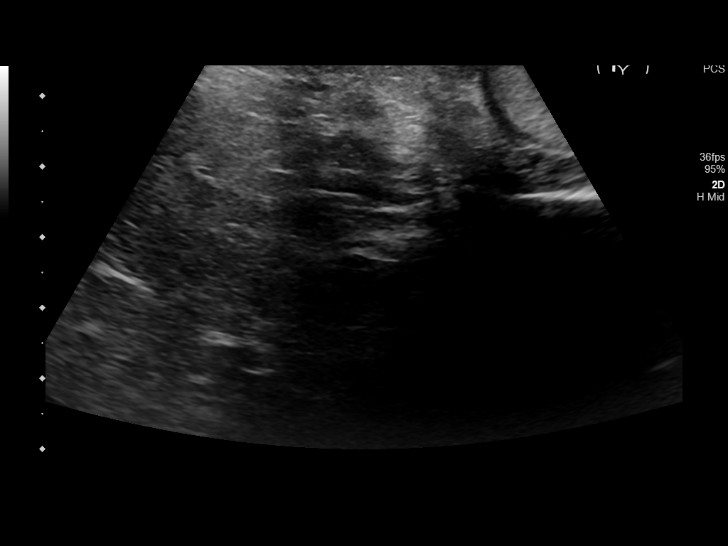

[14 of 14 positions shown; findings below may reference images not displayed]

FINDINGS: Ultrasound of the RIGHT groin/inguinal region was performed. No soft
tissue mass or fluid collection. No enlarged lymph nodes are
identified.
IMPRESSION: Normal ultrasound of the RIGHT groin/inguinal region.
